# Patient Record
Sex: Female | Born: 1955 | Race: Black or African American | Marital: Married | Smoking: Former smoker
Health system: Southern US, Community
[De-identification: ages and names within clinical notes are randomized; demographics above are authoritative.]

## PROBLEM LIST (undated history)

## (undated) DIAGNOSIS — I10 Essential (primary) hypertension: Secondary | ICD-10-CM

## (undated) DIAGNOSIS — N950 Postmenopausal bleeding: Secondary | ICD-10-CM

## (undated) DIAGNOSIS — H409 Unspecified glaucoma: Secondary | ICD-10-CM

## (undated) HISTORY — PX: APPENDECTOMY: SHX54

## (undated) HISTORY — PX: TUBAL LIGATION: SHX77

---

## 2000-09-06 HISTORY — PX: BREAST EXCISIONAL BIOPSY: SUR124

## 2019-12-26 ENCOUNTER — Other Ambulatory Visit: Payer: Self-pay | Admitting: Obstetrics and Gynecology

## 2020-01-07 ENCOUNTER — Ambulatory Visit: Payer: BC Managed Care – PPO | Admitting: Family Medicine

## 2020-01-08 NOTE — H&P (Signed)
Procedure will be LAVH and BSO

## 2020-01-08 NOTE — H&P (Signed)
Paige Hayes is a 64 y.o. female here for LAVH and bilateral salpingectomy   Pt with SALINE  [place]  Fibroid  Encroaching on post wall of endom= 2.69 x 1.96 cm  trv   Ut    Fibroid mid ut post to endom= avg  40 mm    43 x 34 x 42 mm  ENDOMETRIUM=8.05 mm     bil ovs wnl .pt with PMB and cervcal polyp ( benign )   EMBX showed endometritis   Pt states that she had a ruptured appendix after child birth .    Past Medical History:  has no past medical history on file.  Past Surgical History:  has a past surgical history that includes eye pressure surgery (11/02/2019, 11/26/2019); Tubal ligation; and tubal anastomosis. Family History: family history is not on file. Social History:  reports that she has never smoked. She has never used smokeless tobacco. She reports previous alcohol use. She reports previous drug use. OB/GYN History:          OB History    Gravida  4   Para  4   Term  4   Preterm      AB      Living        SAB      TAB      Ectopic      Molar      Multiple      Live Births             Allergies: has No Known Allergies. Medications:  Current Outpatient Medications:  .  brimonidine (ALPHAGAN) 0.2 % ophthalmic solution, INSTILL 1 DROP IN LEFT EYE TWICE DAILY, Disp: , Rfl:  .  dorzolamide-timoloL (COSOPT) 22.3-6.8 mg/mL ophthalmic solution, INSTILL 1 DROP INTO BOTH EYES TWICE A DAY, Disp: , Rfl:  .  ofloxacin (OCUFLOX) 0.3 % ophthalmic solution, INSTILL 1 DROP INTO AFFECTED EYE 4 TIMES A DAY, Disp: , Rfl:  .  prednisoLONE acetate (PRED FORTE) 1 % ophthalmic suspension, INSTILL 1 DROP BY OPHTHALMIC ROUTE 6 TIMES EVERY DAY INTO AFFECTED EYE(S), Disp: , Rfl:  .  chlorthalidone 25 MG tablet, Take 1 tablet (25 mg total) by mouth once daily, Disp: 30 tablet, Rfl: 11  Review of Systems: General:                      No fatigue or weight loss Eyes:                           No vision changes Ears:                            No hearing  difficulty Respiratory:                No cough or shortness of breath Pulmonary:                  No asthma or shortness of breath Cardiovascular:           No chest pain, palpitations, dyspnea on exertion Gastrointestinal:          No abdominal bloating, chronic diarrhea, constipations, masses, pain or hematochezia Genitourinary:             No hematuria, dysuria, abnormal vaginal discharge, pelvic pain, Menometrorrhagia Lymphatic:  No swollen lymph nodes Musculoskeletal:         No muscle weakness Neurologic:                  No extremity weakness, syncope, seizure disorder Psychiatric:                  No history of depression, delusions or suicidal/homicidal ideation    Exam:      Vitals:   12/18/19 1622  BP: (!) 180/110  Pulse:     There is no height or weight on file to calculate BMI.  WDWN / black female in NAD   Lungs: CTA  CV : RRR without murmur   Neck:  no thyromegaly Abdomen: soft , no mass, normal active bowel sounds,  non-tender, no rebound tenderness Pelvic: tanner stage 5 ,  External genitalia: vulva /labia no lesions Urethra: no prolapse Vagina: normal physiologic d/c Cervix: no lesions, no cervical motion tenderness   Uterus: normal size shape and contour, non-tender Adnexa: no mass,  non-tender   Saline infusion sonohysterography: betadine prep to the cervix followed by placement of the HSG catheter into the endometrial canal . Sterile H2O is injected while performing a transvaginal u/s . Findings: SALINE [place]  Fibroid Encroaching on post wall of endom= 2.69 x 1.96 cm trv   Ut Fibroid mid ut post to endom= avg 40 mm 43 x 34 x 42 mm  ENDOMETRIUM=8.05 mm    bil ovs wnl    Impression:   The primary encounter diagnosis was PMB (postmenopausal bleeding). A diagnosis of Fibroids, submucosal was also pertinent to this visit.    Plan:   After discussion of option including H/S removal of submucosal  fibroid  And Fx D+C  Vs definitive surgery the pt has opted to under go a LAVH / BSO . Her h/o of a ruptured appendix warrants l/s eval vs straight TVH / bso   Pro and cons discussed regarding each   Risks of the procedure including organ injury , blood loss requiring blood transfusion .infectious diseases , DVT  Have been discussed with the patient . All questions answered .      Caroline Sauger, MD

## 2020-01-15 ENCOUNTER — Encounter
Admission: RE | Admit: 2020-01-15 | Discharge: 2020-01-15 | Disposition: A | Discharge status: Home / Self Care | Payer: BC Managed Care – PPO | Source: Ambulatory Visit | Attending: Obstetrics and Gynecology | Admitting: Obstetrics and Gynecology

## 2020-01-15 ENCOUNTER — Other Ambulatory Visit: Payer: Self-pay

## 2020-01-15 DIAGNOSIS — Z01818 Encounter for other preprocedural examination: Secondary | ICD-10-CM | POA: Diagnosis not present

## 2020-01-15 HISTORY — DX: Postmenopausal bleeding: N95.0

## 2020-01-15 HISTORY — DX: Essential (primary) hypertension: I10

## 2020-01-15 HISTORY — DX: Unspecified glaucoma: H40.9

## 2020-01-15 NOTE — Patient Instructions (Signed)
Your procedure is scheduled on: Friday 5/21 Report to [facility] To find out your arrival time please call [phone] between 1PM - 3PM on Thurs 5/20.  Remember: Instructions that are not followed completely may result in serious medical risk,  up to and including death, or upon the discretion of your surgeon and anesthesiologist your  surgery may need to be rescheduled.     _X__ 1. Do not eat food after midnight the night before your procedure.                 No gum chewing or hard candies. You may drink clear liquids up to 2 hours                 before you are scheduled to arrive for your surgery- DO not drink clear                 liquids within 2 hours of the start of your surgery.                 Clear Liquids include:  water, apple juice without pulp, clear Gatorade, G2 or                  Gatorade Zero (avoid Red/Purple/Blue), Black Coffee or Tea (Do not add                 anything to coffee or tea). __x___2.   Complete the carbohydrate drink provided to you, 2 hours before arrival.  __X__2.  On the morning of surgery brush your teeth with toothpaste and water, you                may rinse your mouth with mouthwash if you wish.  Do not swallow any toothpaste of mouthwash.     _X__ 3.  No Alcohol for 24 hours before or after surgery.   _X__ 4.  Do Not Smoke or use e-cigarettes For 24 Hours Prior to Your Surgery.                 Do not use any chewable tobacco products for at least 6 hours prior to                 Surgery.  _X__  5.  Do not use any recreational drugs (marijuana, cocaine, heroin, ecstacy, MDMA or other)                For at least one week prior to your surgery.  Combination of these drugs with anesthesia                May have life threatening results.  ____  6.  Bring all medications with you on the day of surgery if instructed.   __x_  7.  Notify your doctor if there is any change in your medical condition   (cold, fever, infections).     Do not wear jewelry, make-up, hairpins, clips or nail polish. Do not wear lotions, powders, or perfumes. You may wear deodorant. Do not shave 48 hours prior to surgery.  Do not bring valuables to the hospital.    [facility] is not responsible for any belongings or valuables.  Contacts, dentures or bridgework may not be worn into surgery. Leave your suitcase in the car. After surgery it may be brought to your room. For patients admitted to the hospital, discharge time is determined by your treatment team.   Patients discharged the day  of surgery will not be allowed to drive home.   Make arrangements for someone to be with you for the first 24 hours of your Same Day Discharge.    Please read over the following fact sheets that you were given: Incentive spirometry   __x__ Take these medicines the morning of surgery with A SIP OF WATER:    1. brimonidine (ALPHAGAN) 0.2 % ophthalmic solution  2. dorzolamide-timolol (COSOPT) 22.3-6.8 MG/ML ophthalmic solution  3. prednisoLONE acetate (PRED FORTE) 1 % ophthalmic suspension  4.  5.  6.  ____ Fleet Enema (as directed)   __x__ Use CHG Soap (or wipes) as directed  ____ Use Benzoyl Peroxide Gel as instructed  ____ Use inhalers on the day of surgery  ____ Stop metformin 2 days prior to surgery    ____ Take 1/2 of usual insulin dose the night before surgery. No insulin the morning          of surgery.   ____ Stop Coumadin/Plavix/aspirin on   __x__ Stop Anti-inflammatories no ibuprofen, aleve or aspirin on 5/14    _x___ Stop supplements until after surgery.  Omega-3 Fatty Acids (FISH OIL) 500 MG CAPS  ____ Bring C-Pap to the hospital.

## 2020-01-21 ENCOUNTER — Encounter
Admission: RE | Admit: 2020-01-21 | Discharge: 2020-01-21 | Disposition: A | Discharge status: Home / Self Care | Payer: BC Managed Care – PPO | Source: Ambulatory Visit | Attending: Obstetrics and Gynecology | Admitting: Obstetrics and Gynecology

## 2020-01-21 ENCOUNTER — Other Ambulatory Visit: Payer: Self-pay

## 2020-01-21 DIAGNOSIS — Z01818 Encounter for other preprocedural examination: Secondary | ICD-10-CM | POA: Diagnosis present

## 2020-01-21 DIAGNOSIS — I10 Essential (primary) hypertension: Secondary | ICD-10-CM

## 2020-01-21 LAB — POTASSIUM: Potassium: 3.3 mmol/L — ABNORMAL LOW (ref 3.5–5.1)

## 2020-01-21 LAB — TYPE AND SCREEN
ABO/RH(D): O NEG
Antibody Screen: NEGATIVE

## 2020-01-23 ENCOUNTER — Other Ambulatory Visit
Admission: RE | Admit: 2020-01-23 | Discharge: 2020-01-23 | Disposition: A | Discharge status: Home / Self Care | Payer: BC Managed Care – PPO | Source: Ambulatory Visit | Attending: Obstetrics and Gynecology | Admitting: Obstetrics and Gynecology

## 2020-01-23 ENCOUNTER — Other Ambulatory Visit: Payer: Self-pay

## 2020-01-23 DIAGNOSIS — Z20822 Contact with and (suspected) exposure to covid-19: Secondary | ICD-10-CM | POA: Diagnosis not present

## 2020-01-23 DIAGNOSIS — Z01812 Encounter for preprocedural laboratory examination: Secondary | ICD-10-CM | POA: Diagnosis present

## 2020-01-24 LAB — SARS CORONAVIRUS 2 (TAT 6-24 HRS): SARS Coronavirus 2: NEGATIVE

## 2020-01-24 NOTE — Pre-Procedure Instructions (Signed)
Potassium 3.3, faxed results to Dr. Ouida Sills.  Will recheck a.m. of surgery.

## 2020-01-25 ENCOUNTER — Ambulatory Visit
Admission: RE | Admit: 2020-01-25 | Discharge: 2020-01-25 | Disposition: A | Discharge status: Home / Self Care | Payer: BC Managed Care – PPO | Attending: Obstetrics and Gynecology | Admitting: Obstetrics and Gynecology

## 2020-01-25 ENCOUNTER — Ambulatory Visit: Payer: BC Managed Care – PPO | Admitting: Anesthesiology

## 2020-01-25 ENCOUNTER — Encounter: Payer: Self-pay | Admitting: Obstetrics and Gynecology

## 2020-01-25 ENCOUNTER — Other Ambulatory Visit: Payer: Self-pay

## 2020-01-25 ENCOUNTER — Encounter
Admission: RE | Disposition: A | Discharge status: Home / Self Care | Payer: Self-pay | Source: Home / Self Care | Attending: Obstetrics and Gynecology

## 2020-01-25 DIAGNOSIS — N840 Polyp of corpus uteri: Secondary | ICD-10-CM | POA: Diagnosis not present

## 2020-01-25 DIAGNOSIS — N736 Female pelvic peritoneal adhesions (postinfective): Secondary | ICD-10-CM | POA: Diagnosis not present

## 2020-01-25 DIAGNOSIS — N950 Postmenopausal bleeding: Secondary | ICD-10-CM | POA: Diagnosis not present

## 2020-01-25 DIAGNOSIS — D250 Submucous leiomyoma of uterus: Secondary | ICD-10-CM | POA: Insufficient documentation

## 2020-01-25 HISTORY — PX: LAPAROSCOPIC VAGINAL HYSTERECTOMY WITH SALPINGO OOPHORECTOMY: SHX6681

## 2020-01-25 LAB — POCT I-STAT, CHEM 8
BUN: 13 mg/dL (ref 8–23)
Calcium, Ion: 1.19 mmol/L (ref 1.15–1.40)
Chloride: 100 mmol/L (ref 98–111)
Creatinine, Ser: 0.7 mg/dL (ref 0.44–1.00)
Glucose, Bld: 103 mg/dL — ABNORMAL HIGH (ref 70–99)
HCT: 42 % (ref 36.0–46.0)
Hemoglobin: 14.3 g/dL (ref 12.0–15.0)
Potassium: 3.1 mmol/L — ABNORMAL LOW (ref 3.5–5.1)
Sodium: 137 mmol/L (ref 135–145)
TCO2: 28 mmol/L (ref 22–32)

## 2020-01-25 LAB — ABO/RH: ABO/RH(D): O NEG

## 2020-01-25 SURGERY — HYSTERECTOMY, VAGINAL, LAPAROSCOPY-ASSISTED, WITH SALPINGO-OOPHORECTOMY
Anesthesia: General | Laterality: Bilateral

## 2020-01-25 MED ORDER — PHENYLEPHRINE HCL (PRESSORS) 10 MG/ML IV SOLN
INTRAVENOUS | Status: DC | PRN
  Administered 2020-01-25 (×3): 100 ug via INTRAVENOUS

## 2020-01-25 MED ORDER — KETOROLAC TROMETHAMINE 30 MG/ML IJ SOLN
INTRAMUSCULAR | Status: AC
  Filled 2020-01-25: qty 1

## 2020-01-25 MED ORDER — HEMOSTATIC AGENTS (NO CHARGE) OPTIME
TOPICAL | Status: DC | PRN
Start: 2020-01-25 — End: 2020-01-25
  Administered 2020-01-25: 1 via TOPICAL

## 2020-01-25 MED ORDER — POTASSIUM CHLORIDE 10 MEQ/100ML IV SOLN
10.0000 meq | INTRAVENOUS | Status: AC
  Filled 2020-01-25: qty 100

## 2020-01-25 MED ORDER — LIDOCAINE-EPINEPHRINE 1 %-1:100000 IJ SOLN
INTRAMUSCULAR | Status: AC
  Filled 2020-01-25: qty 1

## 2020-01-25 MED ORDER — ONDANSETRON HCL 4 MG/2ML IJ SOLN
INTRAMUSCULAR | Status: AC
  Filled 2020-01-25: qty 2

## 2020-01-25 MED ORDER — ACETAMINOPHEN 500 MG PO TABS: 1000.0000 mg | ORAL_TABLET | ORAL | Status: AC

## 2020-01-25 MED ORDER — KETOROLAC TROMETHAMINE 30 MG/ML IJ SOLN
INTRAMUSCULAR | Status: DC | PRN
  Administered 2020-01-25: 15 mg via INTRAVENOUS

## 2020-01-25 MED ORDER — PROPOFOL 10 MG/ML IV BOLUS
INTRAVENOUS | Status: AC
  Filled 2020-01-25: qty 20

## 2020-01-25 MED ORDER — EPHEDRINE 5 MG/ML INJ
INTRAVENOUS | Status: AC
  Filled 2020-01-25: qty 10

## 2020-01-25 MED ORDER — EPHEDRINE SULFATE 50 MG/ML IJ SOLN
INTRAMUSCULAR | Status: DC | PRN
  Administered 2020-01-25 (×2): 5 mg via INTRAVENOUS

## 2020-01-25 MED ORDER — PROPOFOL 10 MG/ML IV BOLUS
INTRAVENOUS | Status: DC | PRN
  Administered 2020-01-25: 150 mg via INTRAVENOUS

## 2020-01-25 MED ORDER — ROCURONIUM BROMIDE 10 MG/ML (PF) SYRINGE
PREFILLED_SYRINGE | INTRAVENOUS | Status: AC
  Filled 2020-01-25: qty 10

## 2020-01-25 MED ORDER — ROCURONIUM BROMIDE 100 MG/10ML IV SOLN
INTRAVENOUS | Status: DC | PRN
  Administered 2020-01-25: 10 mg via INTRAVENOUS
  Administered 2020-01-25: 50 mg via INTRAVENOUS

## 2020-01-25 MED ORDER — OXYCODONE HCL 5 MG/5ML PO SOLN: 5.0000 mg | Freq: Once | ORAL | Status: DC | PRN

## 2020-01-25 MED ORDER — LIDOCAINE HCL (CARDIAC) PF 100 MG/5ML IV SOSY
PREFILLED_SYRINGE | INTRAVENOUS | Status: DC | PRN
  Administered 2020-01-25: 100 mg via INTRAVENOUS

## 2020-01-25 MED ORDER — FENTANYL CITRATE (PF) 100 MCG/2ML IJ SOLN
INTRAMUSCULAR | Status: AC
  Filled 2020-01-25: qty 2

## 2020-01-25 MED ORDER — FAMOTIDINE 20 MG PO TABS
ORAL_TABLET | ORAL | Status: AC
  Administered 2020-01-25: 20 mg via ORAL
  Filled 2020-01-25: qty 1

## 2020-01-25 MED ORDER — SUCCINYLCHOLINE CHLORIDE 200 MG/10ML IV SOSY
PREFILLED_SYRINGE | INTRAVENOUS | Status: AC
  Filled 2020-01-25: qty 10

## 2020-01-25 MED ORDER — DEXAMETHASONE SODIUM PHOSPHATE 10 MG/ML IJ SOLN
INTRAMUSCULAR | Status: AC
  Filled 2020-01-25: qty 1

## 2020-01-25 MED ORDER — DEXAMETHASONE SODIUM PHOSPHATE 10 MG/ML IJ SOLN
INTRAMUSCULAR | Status: DC | PRN
  Administered 2020-01-25: 5 mg via INTRAVENOUS

## 2020-01-25 MED ORDER — FAMOTIDINE 20 MG PO TABS: 20.0000 mg | ORAL_TABLET | Freq: Once | ORAL | Status: AC

## 2020-01-25 MED ORDER — OXYCODONE HCL 5 MG PO TABS: 5.0000 mg | ORAL_TABLET | Freq: Once | ORAL | Status: DC | PRN

## 2020-01-25 MED ORDER — LACTATED RINGERS IV SOLN: INTRAVENOUS | Status: DC

## 2020-01-25 MED ORDER — CEFAZOLIN SODIUM-DEXTROSE 2-4 GM/100ML-% IV SOLN
INTRAVENOUS | Status: AC
  Filled 2020-01-25: qty 100

## 2020-01-25 MED ORDER — GABAPENTIN 300 MG PO CAPS: 300.0000 mg | ORAL_CAPSULE | ORAL | Status: AC

## 2020-01-25 MED ORDER — SUGAMMADEX SODIUM 200 MG/2ML IV SOLN
INTRAVENOUS | Status: DC | PRN
  Administered 2020-01-25 (×2): 100 mg via INTRAVENOUS

## 2020-01-25 MED ORDER — BUPIVACAINE HCL 0.5 % IJ SOLN
INTRAMUSCULAR | Status: DC | PRN
  Administered 2020-01-25: 11 mL

## 2020-01-25 MED ORDER — BUPIVACAINE HCL (PF) 0.5 % IJ SOLN
INTRAMUSCULAR | Status: AC
  Filled 2020-01-25: qty 30

## 2020-01-25 MED ORDER — POTASSIUM CHLORIDE 10 MEQ/100ML IV SOLN
10.0000 meq | INTRAVENOUS | Status: AC
  Administered 2020-01-25 (×2): 10 meq via INTRAVENOUS
  Filled 2020-01-25 (×2): qty 100

## 2020-01-25 MED ORDER — PHENYLEPHRINE HCL (PRESSORS) 10 MG/ML IV SOLN
INTRAVENOUS | Status: AC
  Filled 2020-01-25: qty 1

## 2020-01-25 MED ORDER — CEFAZOLIN SODIUM-DEXTROSE 2-4 GM/100ML-% IV SOLN
2.0000 g | Freq: Once | INTRAVENOUS | Status: AC
  Administered 2020-01-25: 2 g via INTRAVENOUS

## 2020-01-25 MED ORDER — GABAPENTIN 300 MG PO CAPS
ORAL_CAPSULE | ORAL | Status: AC
  Administered 2020-01-25: 300 mg via ORAL
  Filled 2020-01-25: qty 1

## 2020-01-25 MED ORDER — ACETAMINOPHEN 500 MG PO TABS
ORAL_TABLET | ORAL | Status: AC
  Administered 2020-01-25: 1000 mg via ORAL
  Filled 2020-01-25: qty 2

## 2020-01-25 MED ORDER — LIDOCAINE-EPINEPHRINE 1 %-1:100000 IJ SOLN
INTRAMUSCULAR | Status: DC | PRN
  Administered 2020-01-25: 16 mL

## 2020-01-25 MED ORDER — FENTANYL CITRATE (PF) 100 MCG/2ML IJ SOLN: 25.0000 ug | INTRAMUSCULAR | Status: DC | PRN

## 2020-01-25 MED ORDER — LIDOCAINE HCL (PF) 2 % IJ SOLN
INTRAMUSCULAR | Status: AC
  Filled 2020-01-25: qty 5

## 2020-01-25 MED ORDER — ONDANSETRON HCL 4 MG/2ML IJ SOLN
INTRAMUSCULAR | Status: DC | PRN
  Administered 2020-01-25: 4 mg via INTRAVENOUS

## 2020-01-25 MED ORDER — FENTANYL CITRATE (PF) 100 MCG/2ML IJ SOLN
INTRAMUSCULAR | Status: DC | PRN
  Administered 2020-01-25 (×2): 50 ug via INTRAVENOUS

## 2020-01-25 SURGICAL SUPPLY — 53 items
APPLICATOR ARISTA FLEXITIP XL (MISCELLANEOUS) ×2 IMPLANT
BAG URINE DRAIN 2000ML AR STRL (UROLOGICAL SUPPLIES) ×3 IMPLANT
BLADE SURG SZ11 CARB STEEL (BLADE) ×3 IMPLANT
CATH FOLEY 2WAY  5CC 16FR (CATHETERS) ×2
CATH ROBINSON RED A/P 16FR (CATHETERS) ×3 IMPLANT
CATH URTH 16FR FL 2W BLN LF (CATHETERS) ×1 IMPLANT
CHLORAPREP W/TINT 26 (MISCELLANEOUS) ×3 IMPLANT
CLOSURE WOUND 1/2 X4 (GAUZE/BANDAGES/DRESSINGS) ×1
COVER WAND RF STERILE (DRAPES) ×3 IMPLANT
DRAPE SURG 17X11 SM STRL (DRAPES) ×3 IMPLANT
DRSG TEGADERM 2-3/8X2-3/4 SM (GAUZE/BANDAGES/DRESSINGS) ×12 IMPLANT
ELECT REM PT RETURN 9FT ADLT (ELECTROSURGICAL) ×3
ELECTRODE REM PT RTRN 9FT ADLT (ELECTROSURGICAL) ×1 IMPLANT
FILTER LAP SMOKE EVAC STRL (MISCELLANEOUS) ×3 IMPLANT
GAUZE 4X4 16PLY RFD (DISPOSABLE) ×3 IMPLANT
GLOVE SURG SYN 8.0 (GLOVE) ×3 IMPLANT
GLOVE SURG SYN 8.0 PF PI (GLOVE) ×1 IMPLANT
GOWN STRL REUS W/ TWL LRG LVL3 (GOWN DISPOSABLE) ×3 IMPLANT
GOWN STRL REUS W/ TWL XL LVL3 (GOWN DISPOSABLE) ×1 IMPLANT
GOWN STRL REUS W/TWL LRG LVL3 (GOWN DISPOSABLE) ×6
GOWN STRL REUS W/TWL XL LVL3 (GOWN DISPOSABLE) ×2
GRASPER SUT TROCAR 14GX15 (MISCELLANEOUS) IMPLANT
HEMOSTAT ARISTA ABSORB 3G PWDR (HEMOSTASIS) ×2 IMPLANT
IRRIGATION STRYKERFLOW (MISCELLANEOUS) ×1 IMPLANT
IRRIGATOR STRYKERFLOW (MISCELLANEOUS) ×3
IV LACTATED RINGERS 1000ML (IV SOLUTION) ×3 IMPLANT
KIT PINK PAD W/HEAD ARE REST (MISCELLANEOUS) ×3
KIT PINK PAD W/HEAD ARM REST (MISCELLANEOUS) ×1 IMPLANT
KIT TURNOVER CYSTO (KITS) ×3 IMPLANT
LABEL OR SOLS (LABEL) ×1 IMPLANT
NEEDLE HYPO 22GX1.5 SAFETY (NEEDLE) ×3 IMPLANT
PACK BASIN MINOR (MISCELLANEOUS) ×3 IMPLANT
PACK GYN LAPAROSCOPIC (MISCELLANEOUS) ×3 IMPLANT
PAD OB MATERNITY 4.3X12.25 (PERSONAL CARE ITEMS) ×3 IMPLANT
SET WALTER ACTIVATION W/DRAPE (SET/KITS/TRAYS/PACK) ×2 IMPLANT
SHEARS HARMONIC ACE PLUS 36CM (ENDOMECHANICALS) ×2 IMPLANT
SLEEVE ENDOPATH XCEL 5M (ENDOMECHANICALS) ×6 IMPLANT
SPONGE GAUZE 2X2 8PLY STER LF (GAUZE/BANDAGES/DRESSINGS) ×3
SPONGE GAUZE 2X2 8PLY STRL LF (GAUZE/BANDAGES/DRESSINGS) ×6 IMPLANT
STRIP CLOSURE SKIN 1/2X4 (GAUZE/BANDAGES/DRESSINGS) ×2 IMPLANT
SUT VIC AB 0 CT1 27 (SUTURE) ×4
SUT VIC AB 0 CT1 27XCR 8 STRN (SUTURE) ×2 IMPLANT
SUT VIC AB 0 CT1 36 (SUTURE) ×6 IMPLANT
SUT VIC AB 0 CT2 27 (SUTURE) ×3 IMPLANT
SUT VIC AB 2-0 SH 27 (SUTURE) ×2
SUT VIC AB 2-0 SH 27XBRD (SUTURE) ×1 IMPLANT
SUT VIC AB 2-0 UR6 27 (SUTURE) IMPLANT
SUT VIC AB 4-0 SH 27 (SUTURE) ×4
SUT VIC AB 4-0 SH 27XANBCTRL (SUTURE) ×1 IMPLANT
SYR 10ML LL (SYRINGE) ×3 IMPLANT
SYR CONTROL 10ML LL (SYRINGE) ×3 IMPLANT
TROCAR XCEL NON-BLD 5MMX100MML (ENDOMECHANICALS) ×3 IMPLANT
TUBING EVAC SMOKE HEATED PNEUM (TUBING) ×3 IMPLANT

## 2020-01-25 NOTE — Anesthesia Preprocedure Evaluation (Signed)
Anesthesia Evaluation  Patient identified by MRN, date of birth, ID band Patient awake    Reviewed: Allergy & Precautions, H&P , NPO status , Patient's Chart, lab work & pertinent test results  History of Anesthesia Complications Negative for: history of anesthetic complications  Airway Mallampati: III  TM Distance: >3 FB Neck ROM: full    Dental  (+) Chipped, Poor Dentition   Pulmonary neg shortness of breath, former smoker,    Pulmonary exam normal        Cardiovascular hypertension, (-) angina(-) Past MI and (-) DOE negative cardio ROS Normal cardiovascular exam     Neuro/Psych negative neurological ROS  negative psych ROS   GI/Hepatic negative GI ROS, Neg liver ROS, neg GERD  ,  Endo/Other  negative endocrine ROS  Renal/GU      Musculoskeletal   Abdominal   Peds  Hematology negative hematology ROS (+)   Anesthesia Other Findings Past Medical History: No date: Glaucoma No date: Hypertension No date: Post-menopausal bleeding  Past Surgical History: No date: APPENDECTOMY No date: TUBAL LIGATION  BMI    Body Mass Index: 38.56 kg/m      Reproductive/Obstetrics negative OB ROS                             Anesthesia Physical Anesthesia Plan  ASA: II  Anesthesia Plan: General ETT   Post-op Pain Management:    Induction: Intravenous  PONV Risk Score and Plan: Ondansetron, Dexamethasone, Midazolam and Treatment may vary due to age or medical condition  Airway Management Planned: Oral ETT  Additional Equipment:   Intra-op Plan:   Post-operative Plan: Extubation in OR  Informed Consent: I have reviewed the patients History and Physical, chart, labs and discussed the procedure including the risks, benefits and alternatives for the proposed anesthesia with the patient or authorized representative who has indicated his/her understanding and acceptance.     Dental  Advisory Given  Plan Discussed with: Anesthesiologist, CRNA and Surgeon  Anesthesia Plan Comments: (Patient consented for risks of anesthesia including but not limited to:  - adverse reactions to medications - damage to eyes, teeth, lips or other oral mucosa - nerve damage due to positioning  - sore throat or hoarseness - Damage to heart, brain, nerves, lungs or loss of life  Patient voiced understanding.)        Anesthesia Quick Evaluation

## 2020-01-25 NOTE — Anesthesia Procedure Notes (Signed)
Procedure Name: Intubation Date/Time: 01/25/2020 7:39 AM Performed by: Zetta Bills, CRNA Pre-anesthesia Checklist: Patient identified, Emergency Drugs available, Suction available and Patient being monitored Patient Re-evaluated:Patient Re-evaluated prior to induction Oxygen Delivery Method: Circle system utilized Preoxygenation: Pre-oxygenation with 100% oxygen Induction Type: IV induction Ventilation: Mask ventilation without difficulty Grade View: Grade I Tube type: Oral Tube size: 7.0 mm Number of attempts: 1 Airway Equipment and Method: Stylet Placement Confirmation: ETT inserted through vocal cords under direct vision Secured at: 21 cm Tube secured with: Tape Dental Injury: Teeth and Oropharynx as per pre-operative assessment

## 2020-01-25 NOTE — Brief Op Note (Signed)
01/25/2020  10:44 AM  PATIENT:  Paige Hayes  64 y.o. female  PRE-OPERATIVE DIAGNOSIS:  postmenopausal bleeding, sumucosal fibroid  POST-OPERATIVE DIAGNOSIS:  postmenopausal bleeding, sumucosal fibroid Extensive pelvic adhesions  PROCEDURE:  Procedure(s): LAPAROSCOPIC ASSISTED VAGINAL HYSTERECTOMY WITH SALPINGO OOPHORECTOMY (Bilateral)abdominopelvic adhesiolysis SURGEON:  Surgeon(s) and Role:    * Schermerhorn, Gwen Her, MD - Primary    * Benjaman Kindler, MD - Assisting  PHYSICIAN ASSISTANT: PA Student Badalemente   ASSISTANTS: none   ANESTHESIA:   general  EBL:  125 mL   UO 200 cc  BLOOD ADMINISTERED:none  DRAINS: none   LOCAL MEDICATIONS USED:  MARCAINE    and LIDOCAINE   SPECIMEN:  Source of Specimen:  cervix , uterus , fallopian tubes bilateral and ovaries   DISPOSITION OF SPECIMEN:  PATHOLOGY  COUNTS:  YES  TOURNIQUET:  * No tourniquets in log *  DICTATION: .Other Dictation: Dictation Number verbal   PLAN OF CARE: Discharge to home after PACU  PATIENT DISPOSITION:  PACU - hemodynamically stable.   Delay start of Pharmacological VTE agent (>24hrs) due to surgical blood loss or risk of bleeding: not applicable

## 2020-01-25 NOTE — Op Note (Signed)
Paige Hayes, Paige Hayes MEDICAL RECORD [id number] ACCOUNT [id number] DATE OF BIRTH:Mar 07, 1956 FACILITY: [facility] LOCATION: [facility] PHYSICIAN:Paige Hayes Paige Class, MD  OPERATIVE REPORT  DATE OF PROCEDURE:  01/25/2020  PREOPERATIVE DIAGNOSES: 1.  Postmenopausal bleeding. 2.  Submucosal fibroid.  POSTOPERATIVE DIAGNOSES: 1.  Postmenopausal bleeding. 2.  Submucosal fibroid. 3.  Extensive abdominopelvic adhesions.  PROCEDURES: 1.  Extensive abdominal and pelvic adhesiolysis, greater than 50% of operating time. 2.  Laparoscopic-assisted vaginal hysterectomy with bilateral salpingo-oophorectomy.  SURGEON:  Paige Baltimore, MD  FIRST ASSISTANT:  Paige Kindler, MD   SECOND ASSISTANT:  PA student, Paige Hayes.  ANESTHESIA:  General endotracheal anesthesia.  INDICATIONS:  A 64 year old female with postmenopausal bleeding.  The patient underwent a workup in the clinic that revealed a 2.7 x 2.0 submucosal fibroid.  Endometrial biopsy negative, except for endometritis.  The patient has elected for definitive  surgical intervention.  The patient is status post a ruptured appendix many years before.  DESCRIPTION OF PROCEDURE:  After adequate general endotracheal anesthesia, the patient was placed in dorsal supine position, legs in the Berwind stirrups.  The patient's abdomen, perineum and vagina were prepped and draped in normal sterile fashion.   Timeout was performed.  The patient did receive 2 g IV Ancef prior to commencement of the case.  Given the patient has a poorly shaped scar from the umbilicus to the midportion of the lower abdomen, it was decided that a Palmer's point port site for the  laparoscope would be used.  NG tube was placed by anesthesia.  A port site was placed in the left mid clavicular, 3 cm inferior to the last palpable rib.  Skin was injected with 0.5% Marcaine.  A 5 mm laparoscope was advanced into the abdominal cavity  under direct visualization with the  Optiview cannula.  The patient's abdomen was insufflated.  Significant adhesions were noted inferior and lateral from the umbilicus, all the way down to the pelvis.  A second port placement was placed at the left lower  quadrant 3 cm medial to the left anterior iliac spine and a 5 mm trocar was advanced under direct visualization.  A third port placement was placed at the right lower quadrant 3 cm medial to the right anterior iliac spine and under direct visualization,  a 5 mm port was advanced.  The Harmonic scalpel was brought up to the operative field.  Extensive pelvic and abdominal adhesiolysis then took place.  This portion of the adhesiolysis was greater than 50% of total operating time.  Once the adhesions were  removed, a fourth port site was placed at the infraumbilical area.  Under direct visualization, a 5 mm port was advanced.  The patient was placed in Trendelenburg and the left fallopian tube and ovary were identified and placed on traction so that the  left infundibulopelvic ligament could be cauterized, ligated and transected.  Preceding this, the left ureter was visualized with normal peristaltic activity.  Dissection continued through the broad ligaments, through the round ligament and to the left  uterine artery, which was skeletonized, cauterized and transected.  The bladder flap was opened with the Harmonic scalpel and the vesicouterine peritoneal tissues were pushed caudad.  A similar procedure was repeated on the patient's right side.  Again,  the right infundibulopelvic ligament was cauterized, transected and the broad ligament and the right uterine artery was skeletonized, cauterized and transected.  Good hemostasis was noted.  At this point, the procedure proceeded vaginally where a  weighted speculum was placed in the posterior  vaginal vault and the anterior cervix was grasped with 2 thyroid tenacula and circumferentially injected with 1% lidocaine with 1:100,000 epinephrine.  The  posterior cul-de-sac was then placed on tension and  a direct posterior colpotomy incision was made.  Upon entry into the posterior cul-de-sac, a long weighted bill speculum was placed. The uterosacral ligaments were bilaterally clamped, transected and tagged for later identification.  The cervix was then  circumferentially incised with the Bovie and the anterior cul-de-sac was entered without difficulty.  The cardinal ligaments were bilaterally clamped, transected, suture ligated with 0 Vicryl suture and 2 additional bites on each side of the uterus with  appropriate suturing allowed for delivery of the cervix, the uterus, bilateral fallopian tubes and ovaries.  Good hemostasis was noted and the vaginal cuff was then closed with a running 0 Vicryl suture.  Of note, the bladder was initially drained at  commencement of the case prior to laparoscopic portion of the procedure.  At the end of the suturing of the vaginal vault, the bladder was again catheterized, yielding clear urine.  Gloves were changed, gowns were changed and attention was directed  abdominally again where the patient's abdomen was insufflated again.  There were some small oozing areas of the left lateral aspect of the vaginal cuff which was controlled with cautery.  Ureters were identified bilaterally with normal peristaltic  activity.  Intra-abdominal pressure was lowered to 7 mmHg and good hemostasis was noted of all pedicles.  Arista was then placed via the lower port site into the left lower vaginal cuff area that previously was oozing and good hemostasis was noted.  The  patient's abdomen was deflated and all port sites were removed and each port site was closed with an interrupted 4-0 Vicryl suture.  Sterile dressing applied.  COMPLICATIONS:  There were no complications.  ESTIMATED BLOOD LOSS:  125 mL.  INTRAOPERATIVE FLUIDS:  450 mL.  URINE OUTPUT:  200 mL.  The patient was taken to recovery room in good  condition.  VN/NUANCE  D:01/25/2020 T:01/25/2020 JOB:[id number]

## 2020-01-25 NOTE — Anesthesia Postprocedure Evaluation (Signed)
Anesthesia Post Note  Patient: Paige Hayes  Procedure(s) Performed: LAPAROSCOPIC ASSISTED VAGINAL HYSTERECTOMY WITH SALPINGO OOPHORECTOMY (Bilateral )  Patient location during evaluation: PACU Anesthesia Type: General Level of consciousness: awake and alert Pain management: pain level controlled Vital Signs Assessment: post-procedure vital signs reviewed and stable Respiratory status: spontaneous breathing, nonlabored ventilation, respiratory function stable and patient connected to nasal cannula oxygen Cardiovascular status: blood pressure returned to baseline and stable Postop Assessment: no apparent nausea or vomiting Anesthetic complications: no     Last Vitals:  Vitals:   01/25/20 1135 01/25/20 1200  BP: 126/83   Pulse: 80 73  Resp: 18 18  Temp:    SpO2: 98% 98%    Last Pain:  Vitals:   01/25/20 1200  TempSrc:   PainSc: 0-No pain                 Precious Haws Kirstein Baxley

## 2020-01-25 NOTE — Transfer of Care (Signed)
Immediate Anesthesia Transfer of Care Note  Patient: Paige Hayes  Procedure(s) Performed: LAPAROSCOPIC ASSISTED VAGINAL HYSTERECTOMY WITH SALPINGO OOPHORECTOMY (Bilateral )  Patient Location: PACU  Anesthesia Type:General  Level of Consciousness: awake  Airway & Oxygen Therapy: Patient Spontanous Breathing  Post-op Assessment: Report given to RN  Post vital signs: stable  Last Vitals:  Vitals Value Taken Time  BP 142/84 01/25/20 1056  Temp 36.6 C 01/25/20 1056  Pulse 71 01/25/20 1100  Resp 17 01/25/20 1100  SpO2 95 % 01/25/20 1100  Vitals shown include unvalidated device data.  Last Pain:  Vitals:   01/25/20 0614  TempSrc: Tympanic  PainSc: 0-No pain      Patients Stated Pain Goal: 0 ([id number])  Complications: No apparent anesthesia complications

## 2020-01-25 NOTE — Discharge Instructions (Signed)

## 2020-01-25 NOTE — Progress Notes (Signed)
Scant vaginal bleeding on peri pad

## 2020-01-29 LAB — SURGICAL PATHOLOGY

## 2021-03-03 ENCOUNTER — Other Ambulatory Visit: Payer: Self-pay | Admitting: Obstetrics and Gynecology

## 2021-03-03 DIAGNOSIS — Z1231 Encounter for screening mammogram for malignant neoplasm of breast: Secondary | ICD-10-CM

## 2021-03-05 ENCOUNTER — Ambulatory Visit
Admission: RE | Admit: 2021-03-05 | Discharge: 2021-03-05 | Disposition: A | Discharge status: Home / Self Care | Payer: BC Managed Care – PPO | Source: Ambulatory Visit | Attending: Obstetrics and Gynecology | Admitting: Obstetrics and Gynecology

## 2021-03-05 ENCOUNTER — Other Ambulatory Visit: Payer: Self-pay

## 2021-03-05 DIAGNOSIS — Z1231 Encounter for screening mammogram for malignant neoplasm of breast: Secondary | ICD-10-CM | POA: Diagnosis present

## 2021-11-23 DIAGNOSIS — H209 Unspecified iridocyclitis: Secondary | ICD-10-CM | POA: Diagnosis not present

## 2021-11-23 DIAGNOSIS — H2013 Chronic iridocyclitis, bilateral: Secondary | ICD-10-CM | POA: Diagnosis not present

## 2021-11-23 DIAGNOSIS — H4043X0 Glaucoma secondary to eye inflammation, bilateral, stage unspecified: Secondary | ICD-10-CM | POA: Diagnosis not present

## 2021-12-15 DIAGNOSIS — Z1211 Encounter for screening for malignant neoplasm of colon: Secondary | ICD-10-CM | POA: Diagnosis not present

## 2021-12-15 DIAGNOSIS — Z01419 Encounter for gynecological examination (general) (routine) without abnormal findings: Secondary | ICD-10-CM | POA: Diagnosis not present

## 2021-12-15 DIAGNOSIS — Z1231 Encounter for screening mammogram for malignant neoplasm of breast: Secondary | ICD-10-CM | POA: Diagnosis not present

## 2021-12-21 DIAGNOSIS — Z01419 Encounter for gynecological examination (general) (routine) without abnormal findings: Secondary | ICD-10-CM | POA: Diagnosis not present

## 2022-01-20 DIAGNOSIS — Z1211 Encounter for screening for malignant neoplasm of colon: Secondary | ICD-10-CM | POA: Diagnosis not present

## 2022-01-25 ENCOUNTER — Other Ambulatory Visit: Payer: Self-pay | Admitting: Obstetrics and Gynecology

## 2022-01-25 DIAGNOSIS — Z1231 Encounter for screening mammogram for malignant neoplasm of breast: Secondary | ICD-10-CM

## 2022-02-23 DIAGNOSIS — H209 Unspecified iridocyclitis: Secondary | ICD-10-CM | POA: Diagnosis not present

## 2022-02-23 DIAGNOSIS — H4043X0 Glaucoma secondary to eye inflammation, bilateral, stage unspecified: Secondary | ICD-10-CM | POA: Diagnosis not present

## 2022-02-23 DIAGNOSIS — H2013 Chronic iridocyclitis, bilateral: Secondary | ICD-10-CM | POA: Diagnosis not present

## 2022-02-23 DIAGNOSIS — H35352 Cystoid macular degeneration, left eye: Secondary | ICD-10-CM | POA: Diagnosis not present

## 2022-03-08 ENCOUNTER — Ambulatory Visit
Admission: RE | Admit: 2022-03-08 | Discharge: 2022-03-08 | Disposition: A | Discharge status: Home / Self Care | Payer: 59 | Source: Ambulatory Visit | Attending: Obstetrics and Gynecology | Admitting: Obstetrics and Gynecology

## 2022-03-08 DIAGNOSIS — Z1231 Encounter for screening mammogram for malignant neoplasm of breast: Secondary | ICD-10-CM | POA: Diagnosis not present

## 2022-04-01 IMAGING — MG MM DIGITAL SCREENING BILAT W/ TOMO AND CAD
6 of 12 series · 6 of 36 positions shown · non-contrast
Comparison: None.

CLINICAL DATA: Screening.

EXAM:
DIGITAL SCREENING BILATERAL MAMMOGRAM WITH TOMOSYNTHESIS AND CAD
TECHNIQUE: Bilateral screening digital craniocaudal and mediolateral oblique
mammograms were obtained. Bilateral screening digital breast
tomosynthesis was performed. The images were evaluated with
computer-aided detection.

[L MLO synth-2D (1 of 2)]
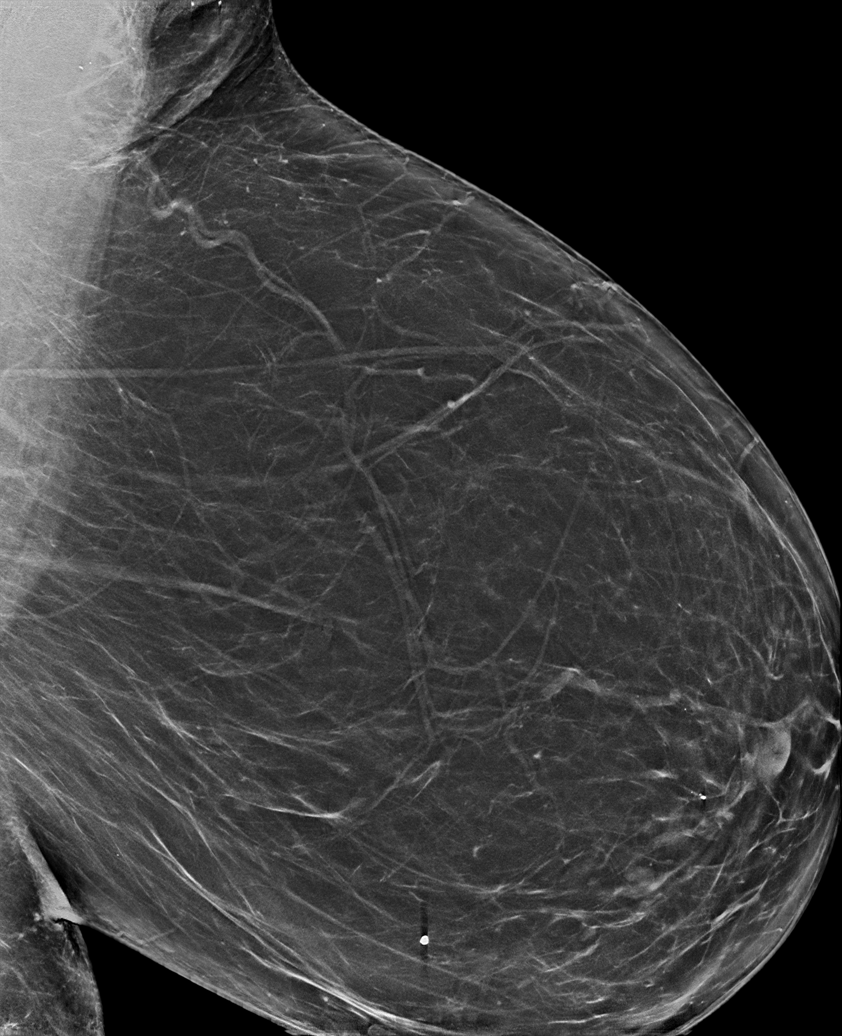

[L CC synth-2D]
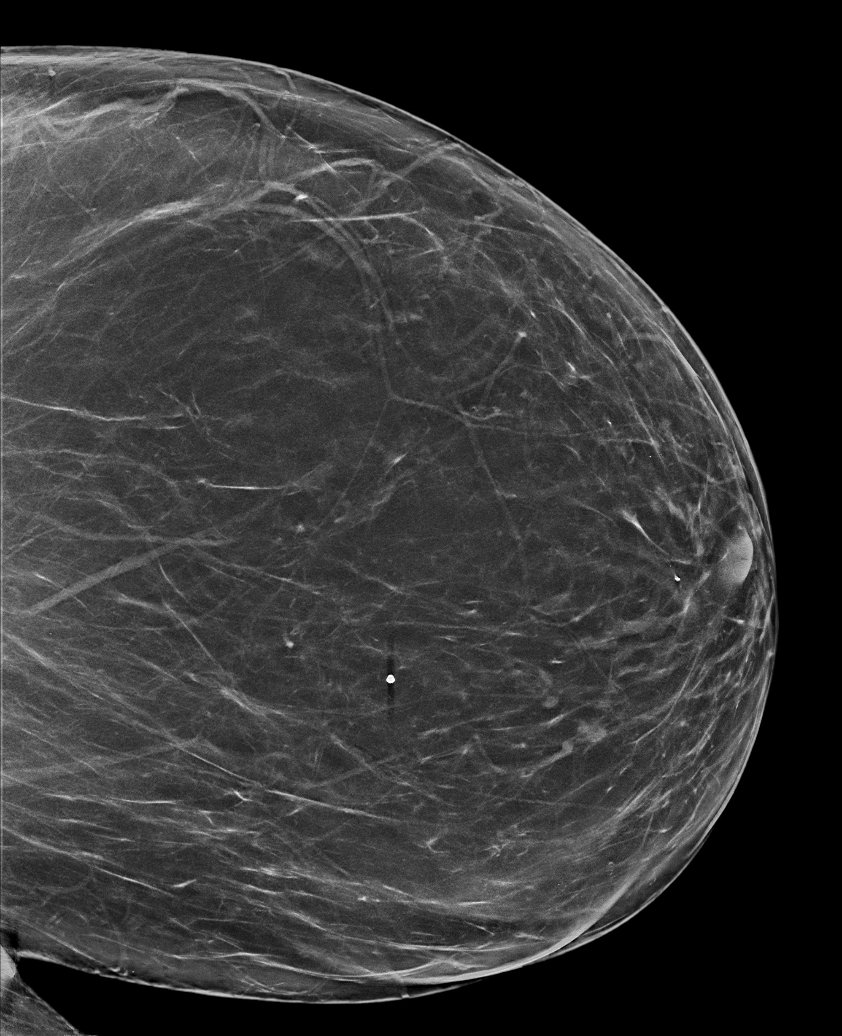

[R MLO synth-2D (1 of 2)]
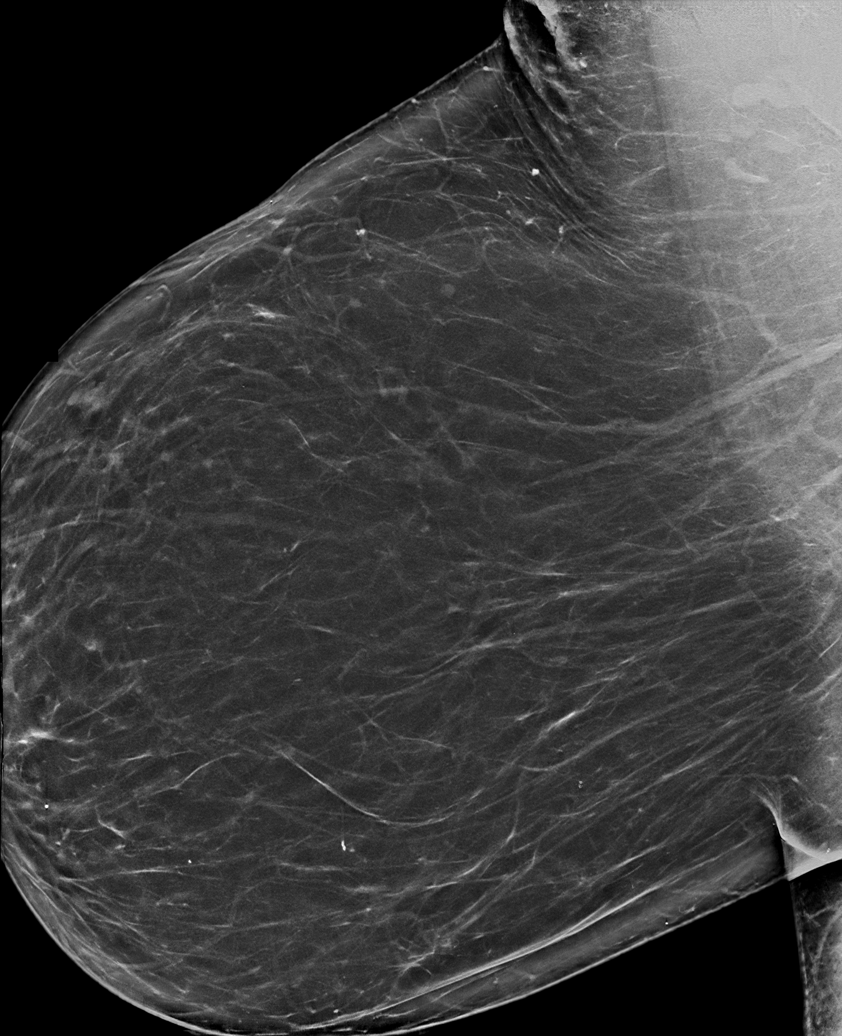

[R MLO synth-2D (2 of 2)]
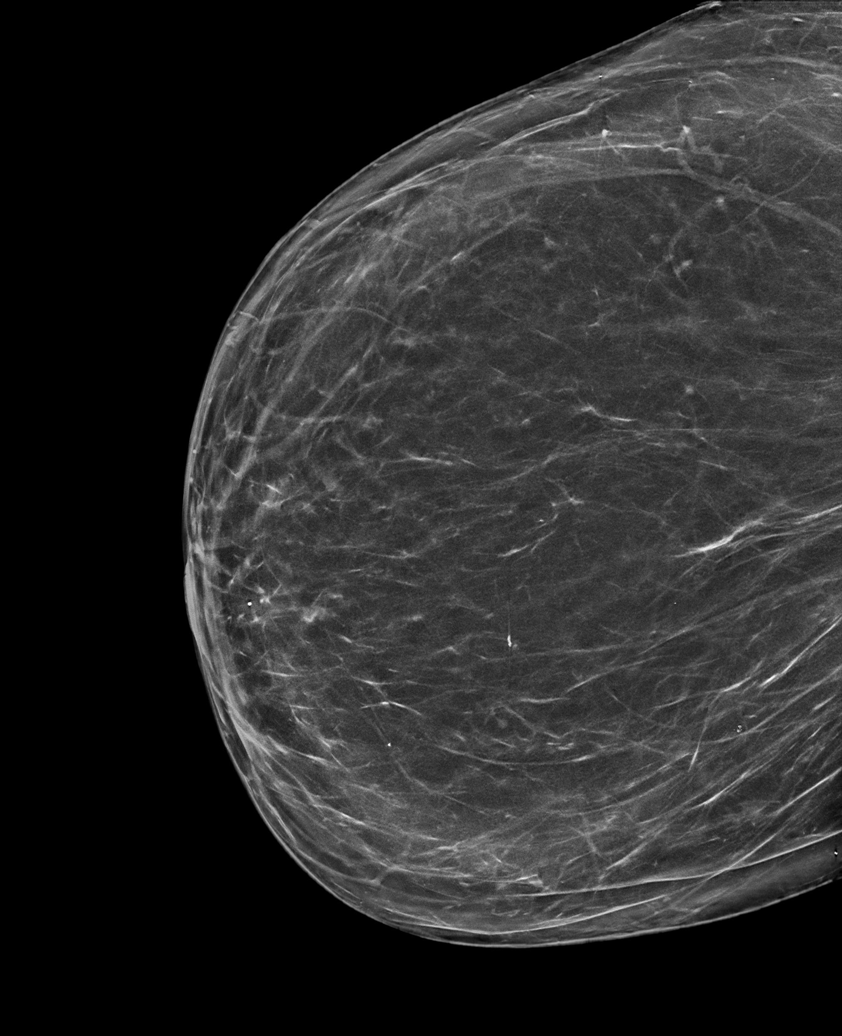

[R CC synth-2D]
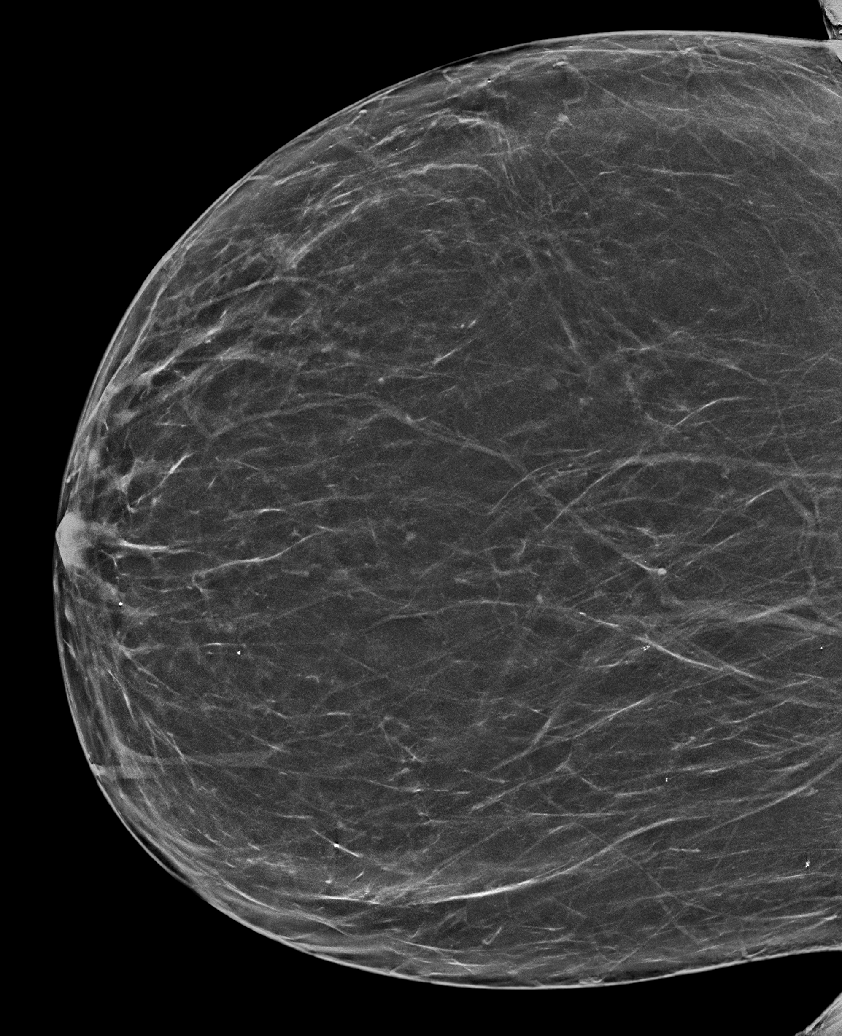

[L MLO synth-2D (2 of 2)]
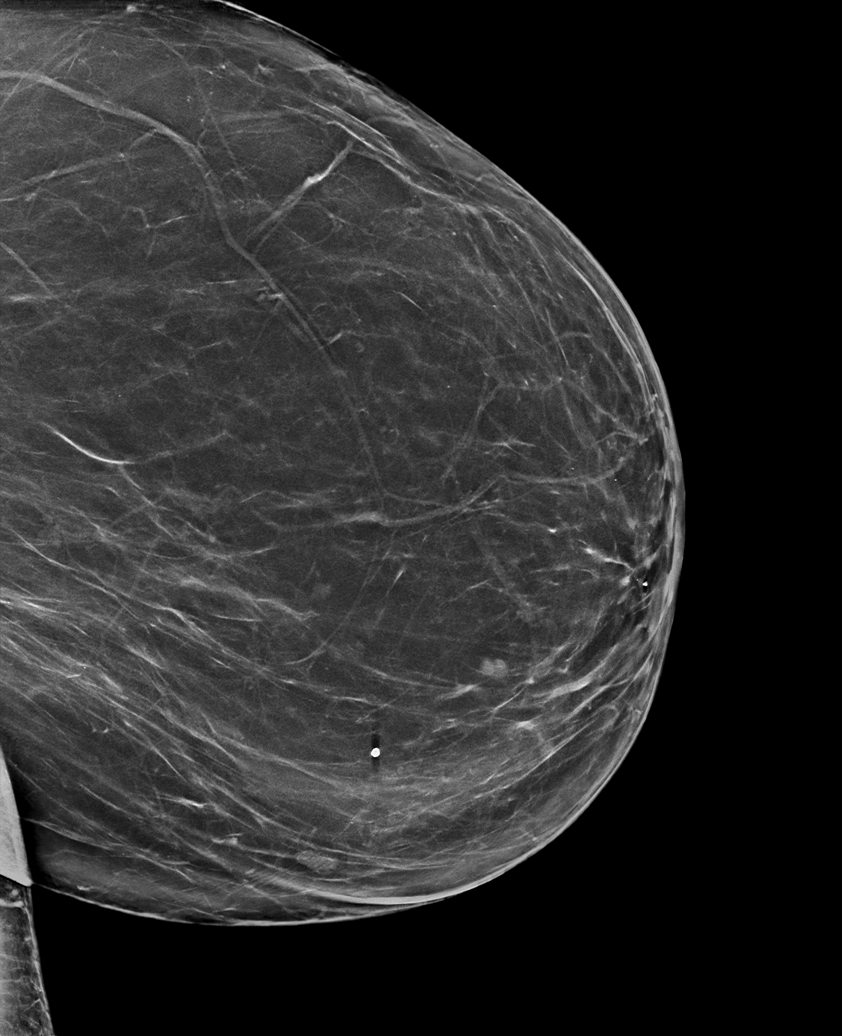

[6 of 36 positions shown; findings below may reference images not displayed]

ACR Breast Density Category b: There are scattered areas of
fibroglandular density.
FINDINGS: There are no findings suspicious for malignancy.
IMPRESSION: No mammographic evidence of malignancy. A result letter of this
screening mammogram will be mailed directly to the patient.

RECOMMENDATION:
Screening mammogram in one year. (Code:XG-X-X7B)

BI-RADS CATEGORY  1: Negative.

## 2022-05-26 DIAGNOSIS — H4043X0 Glaucoma secondary to eye inflammation, bilateral, stage unspecified: Secondary | ICD-10-CM | POA: Diagnosis not present

## 2022-05-26 DIAGNOSIS — H2013 Chronic iridocyclitis, bilateral: Secondary | ICD-10-CM | POA: Diagnosis not present

## 2022-05-26 DIAGNOSIS — H35352 Cystoid macular degeneration, left eye: Secondary | ICD-10-CM | POA: Diagnosis not present

## 2022-05-26 DIAGNOSIS — H209 Unspecified iridocyclitis: Secondary | ICD-10-CM | POA: Diagnosis not present

## 2022-09-29 DIAGNOSIS — H209 Unspecified iridocyclitis: Secondary | ICD-10-CM | POA: Diagnosis not present

## 2022-09-29 DIAGNOSIS — H4043X0 Glaucoma secondary to eye inflammation, bilateral, stage unspecified: Secondary | ICD-10-CM | POA: Diagnosis not present

## 2022-09-29 DIAGNOSIS — H35352 Cystoid macular degeneration, left eye: Secondary | ICD-10-CM | POA: Diagnosis not present

## 2022-09-29 DIAGNOSIS — H2013 Chronic iridocyclitis, bilateral: Secondary | ICD-10-CM | POA: Diagnosis not present

## 2023-01-03 ENCOUNTER — Other Ambulatory Visit: Payer: Self-pay | Admitting: Obstetrics and Gynecology

## 2023-01-03 DIAGNOSIS — Z1231 Encounter for screening mammogram for malignant neoplasm of breast: Secondary | ICD-10-CM

## 2023-03-14 ENCOUNTER — Ambulatory Visit
Admission: RE | Admit: 2023-03-14 | Discharge: 2023-03-14 | Disposition: A | Discharge status: Home / Self Care | Payer: Medicare HMO | Source: Ambulatory Visit | Attending: Obstetrics and Gynecology | Admitting: Obstetrics and Gynecology

## 2023-03-14 DIAGNOSIS — Z1231 Encounter for screening mammogram for malignant neoplasm of breast: Secondary | ICD-10-CM | POA: Insufficient documentation

## 2024-01-09 ENCOUNTER — Other Ambulatory Visit: Payer: Self-pay | Admitting: Obstetrics and Gynecology

## 2024-01-09 DIAGNOSIS — Z1231 Encounter for screening mammogram for malignant neoplasm of breast: Secondary | ICD-10-CM

## 2024-03-15 ENCOUNTER — Ambulatory Visit
Admission: RE | Admit: 2024-03-15 | Discharge: 2024-03-15 | Disposition: A | Discharge status: Home / Self Care | Source: Ambulatory Visit | Attending: Obstetrics and Gynecology | Admitting: Obstetrics and Gynecology

## 2024-03-15 DIAGNOSIS — Z1231 Encounter for screening mammogram for malignant neoplasm of breast: Secondary | ICD-10-CM | POA: Insufficient documentation
